# Patient Record
Sex: Male | Born: 1980 | Race: White | Hispanic: No | Marital: Married | Smoking: Former smoker
Health system: Southern US, Community
[De-identification: ages and names within clinical notes are randomized; demographics above are authoritative.]

## PROBLEM LIST (undated history)

## (undated) DIAGNOSIS — K760 Fatty (change of) liver, not elsewhere classified: Secondary | ICD-10-CM

## (undated) DIAGNOSIS — K219 Gastro-esophageal reflux disease without esophagitis: Secondary | ICD-10-CM

## (undated) DIAGNOSIS — I10 Essential (primary) hypertension: Secondary | ICD-10-CM

## (undated) DIAGNOSIS — F419 Anxiety disorder, unspecified: Secondary | ICD-10-CM

## (undated) HISTORY — PX: DRUG INDUCED ENDOSCOPY: SHX6808

## (undated) HISTORY — PX: NO PAST SURGERIES: SHX2092

---

## 2019-07-21 ENCOUNTER — Other Ambulatory Visit: Payer: Self-pay | Admitting: Neurosurgery

## 2019-07-25 NOTE — H&P (Signed)
Patient ID:   [id number] Patient: Nicholas Jarvis  Date of Birth: [birth year] Visit Type: Office Visit   Date: 07/20/2019 01:00 PM Provider: Danae Orleans. Venetia Maxon MD   This 39 year old male presents for back pain.  HISTORY OF PRESENT ILLNESS: 1.  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  Patient recalls no injury, noting symptoms have increased since August.  Initially, patient found sitting to be painful.  After chiropractic treatments, he notes standing is now painful.  Diclofenac 75 mg b.i.d. Offers little relief  8-10 beers per day recently  SI joint injection offered no relief Chiropractic adjustment offered no relief  History:  GERD, Barrett's esophagus, alcoholism, hypertension, lipoma left lower ribcage Surgical history:  None  MRI on canopy   the patient has a large disc herniation at L5-S1 on the right causing right S1 nerve root compression   he currently grades his pain as ranging from 6-9 out of 10 in severity.    He says he is worse with walking or standing.  He has had a surgical opinion and has been recommended surgery but comes for a 2nd opinion today. he describes that he has a poor quality of life because of this problem and he is not able to function.  He is self-employed as a Curator but says he has to sit all the time as he cannot stand and walk without having worsening pain problems.       PAST MEDICAL/SURGICAL HISTORY:   (Detailed)   Disease/disorder Onset Date Management Date Comments Anxiety     Hypertension        PAST MEDICAL HISTORY, SURGICAL HISTORY, FAMILY HISTORY, SOCIAL HISTORY AND REVIEW OF SYSTEMS I have reviewed the patient's past medical, surgical, family and social history as well as the comprehensive review of systems as included on the [facility] history form dated  07/06/2019, which I have signed.  Family History:  (Detailed)   Social History:  (Detailed) Tobacco use reviewed. Preferred language is [place].   Smoking status: Former smoker.  SMOKING STATUS Type Smoking Status Usage Per Day Years Used Total Pack Years  Former smoker         MEDICATIONS: (added, continued or stopped this visit) Started Medication Directions Instruction Stopped  diclofenac sodium 75 mg tablet,delayed release take 1 tablet by oral route 2 times every day    fluoxetine 40 mg capsule take 1 capsule by oral route  every day in the morning    lisinopril 20 mg tablet take 1 tablet by oral route  every day    pantoprazole 40 mg tablet,delayed release take 1 tablet by oral route  every day      ALLERGIES: Ingredient Reaction Medication Name Comment NO KNOWN ALLERGIES    No known allergies. Reviewed, updated.   REVIEW OF SYSTEMS  See scanned patient registration form, dated 07/06/2019, signed and dated on 07/20/2019  Review of Systems Details System Neg/Pos Details Constitutional Negative Chills, Fatigue, Fever, Malaise, Night sweats, Weight gain and Weight loss. ENMT Negative Ear drainage, Hearing loss, Nasal drainage, Otalgia, Sinus pressure and Sore throat. Eyes Negative Eye discharge, Eye pain and Vision changes. Respiratory Negative Chronic cough, Cough, Dyspnea, Known TB exposure and Wheezing. Cardio Negative Chest pain, Claudication, Edema and Irregular heartbeat/palpitations. GI Negative Abdominal pain, Blood in stool, Change in stool pattern, Constipation, Decreased appetite, Diarrhea, Heartburn, Nausea and Vomiting. GU Negative  Dribbling, Dysuria, Erectile dysfunction, Hematuria, Polyuria (Genitourinary), Slow stream, Urinary frequency, Urinary incontinence and Urinary retention. Endocrine Negative Cold intolerance, Heat intolerance, Polydipsia and Polyphagia. Neuro Positive Gait disturbance,  Numbness in extremity. Psych Negative Anxiety, Depression and Insomnia. Integumentary Negative Brittle hair, Brittle nails, Change in shape/size of mole(s), Hair loss, Hirsutism, Hives, Pruritus, Rash and Skin lesion. MS Positive Back pain. Hema/Lymph Negative Easy bleeding, Easy bruising and Lymphadenopathy. Allergic/Immuno Negative Contact allergy, Environmental allergies, Food allergies and Seasonal allergies. Reproductive Negative Penile discharge and Sexual dysfunction.  PHYSICAL EXAM:  Vitals Date Temp F BP Pulse Ht In Wt Lb BMI BSA Pain Score 07/20/2019  155/116 85 72 271.6 36.84  6/10   PHYSICAL EXAM Details General Level of Distress: no acute distress Overall Appearance: obese  Head and Face  Right Left  Fundoscopic Exam:  normal normal    Cardiovascular Cardiac: regular rate and rhythm without murmur  Right Left  Carotid Pulses: normal normal  Respiratory Lungs: clear to auscultation  Neurological Orientation: normal Recent and Remote Memory: normal Attention Span and Concentration:   normal Language: normal Fund of Knowledge: normal  Right Left Sensation: normal normal Upper Extremity Coordination: normal normal  Lower Extremity Coordination: normal normal  Musculoskeletal Gait and Station: normal  Right Left Upper Extremity Muscle Strength: normal normal Lower Extremity Muscle Strength: normal normal Upper Extremity Muscle Tone:  normal normal Lower Extremity Muscle Tone: normal normal   Motor Strength Upper and lower extremity motor strength was tested in the clinically pertinent muscles.     Deep Tendon Reflexes  Right Left Biceps: normal normal Triceps: normal normal Brachioradialis: normal normal Patellar: normal normal Achilles: absent normal  Sensory Sensation was tested at L1 to S1.   Cranial Nerves II. Optic Nerve/Visual Fields: normal III. Oculomotor: normal IV. Trochlear: normal V.  Trigeminal: normal VI. Abducens: normal VII. Facial: normal VIII. Acoustic/Vestibular: normal IX. Glossopharyngeal: normal X. Vagus: normal XI. Spinal Accessory: normal XII. Hypoglossal: normal  Motor and other Tests Lhermittes: negative Rhomberg: negative Pronator drift: absent     Right Left Hoffman's: normal normal Clonus: normal normal Babinski: normal normal SLR: positive at 45 degrees negative Patrick's Corky Sox): negative negative Toe Walk: normal normal Toe Lift: normal normal Heel Walk: normal normal SI Joint: nontender nontender   Additional Findings:   patient is only able to bend to within 2 ft of the floor with his upper extremities outstretched.  He has right sciatic notch discomfort to palpation.  He is able to stand on his heels and toes.    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  PLAN:  I have advised patient to proceed with surgery for this extremely symptomatic disc herniation.  I have also advised him to cut back on his alcohol consumption.  I have advised him to decrease his usage of nonsteroidal anti-inflammatory medications while he consumes so much alcohol.  Orders: Diagnostic Procedures: Assessment Procedure M51.16 Lumbar Spine- AP/Lat Instruction(s)/Education: Assessment Instruction I10 Lifestyle education [phone] Dietary management education, guidance, and counseling  Completed Orders (this encounter) Order Details Reason Side Interpretation Result Initial Treatment Date Region Lumbar Spine- AP/Lat      07/20/2019 All Levels to All Levels Lifestyle education Patient will follow up with Primary Care Physician.       Dietary management education, guidance, and counseling Encouraged patient to eat well balanced diet.        Assessment/Plan  # Detail Type Description  1. Assessment Disc displacement, lumbar (M51.26).  2. Assessment Low back pain, unspecified back pain laterality, with sciatica presence unspecified (M54.5).     3. Assessment Radiculopathy, lumbosacral region (M54.17).     4. 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     5. Assessment Essential (primary) hypertension (I10).     6. Assessment Body mass index (BMI) 36.0-36.9, adult (Z68.36).  Plan Orders Today's instructions / counseling include(s) Dietary management education, guidance, and counseling. Clinical information/comments: Encouraged patient to eat well balanced diet.       Pain Management Plan Pain Scale: 6/10. Method: Numeric Pain Intensity Scale. Location: back. Onset: 07/20/2019. Duration: varies. Quality: discomforting. Pain management follow-up plan of care: Patient will continue medication management..              Provider:  Danae Orleans. Venetia Maxon MD  07/22/2019 07:39 AM    Dictation edited by: Danae Orleans. Venetia Maxon    CC Providers: Marval Regal [facility] [address] [place],  [place]  [address]   Marval Regal  [facility] [address] [place], [place] [address]               Electronically signed by Danae Orleans. Venetia Maxon MD on 07/22/2019 07:39 AM

## 2019-08-01 ENCOUNTER — Encounter (HOSPITAL_COMMUNITY)
Admission: RE | Admit: 2019-08-01 | Discharge: 2019-08-01 | Disposition: A | Discharge status: Home / Self Care | Payer: BC Managed Care – PPO | Source: Ambulatory Visit | Attending: Neurosurgery | Admitting: Neurosurgery

## 2019-08-01 ENCOUNTER — Other Ambulatory Visit: Payer: Self-pay

## 2019-08-01 ENCOUNTER — Other Ambulatory Visit (HOSPITAL_COMMUNITY)
Admission: RE | Admit: 2019-08-01 | Discharge: 2019-08-01 | Disposition: A | Discharge status: Home / Self Care | Payer: BC Managed Care – PPO | Source: Ambulatory Visit | Attending: Neurosurgery | Admitting: Neurosurgery

## 2019-08-01 ENCOUNTER — Encounter (HOSPITAL_COMMUNITY): Payer: Self-pay

## 2019-08-01 DIAGNOSIS — Z01818 Encounter for other preprocedural examination: Secondary | ICD-10-CM | POA: Insufficient documentation

## 2019-08-01 HISTORY — DX: Essential (primary) hypertension: I10

## 2019-08-01 HISTORY — DX: Anxiety disorder, unspecified: F41.9

## 2019-08-01 HISTORY — DX: Gastro-esophageal reflux disease without esophagitis: K21.9

## 2019-08-01 HISTORY — DX: Fatty (change of) liver, not elsewhere classified: K76.0

## 2019-08-01 LAB — CBC
HCT: 48.2 % (ref 39.0–52.0)
Hemoglobin: 15.5 g/dL (ref 13.0–17.0)
MCH: 31.6 pg (ref 26.0–34.0)
MCHC: 32.2 g/dL (ref 30.0–36.0)
MCV: 98.4 fL (ref 80.0–100.0)
Platelets: 240 10*3/uL (ref 150–400)
RBC: 4.9 MIL/uL (ref 4.22–5.81)
RDW: 13.6 % (ref 11.5–15.5)
WBC: 11.4 10*3/uL — ABNORMAL HIGH (ref 4.0–10.5)
nRBC: 0 % (ref 0.0–0.2)

## 2019-08-01 LAB — SARS CORONAVIRUS 2 (TAT 6-24 HRS): SARS Coronavirus 2: NEGATIVE

## 2019-08-01 LAB — COMPREHENSIVE METABOLIC PANEL
ALT: 128 U/L — ABNORMAL HIGH (ref 0–44)
AST: 93 U/L — ABNORMAL HIGH (ref 15–41)
Albumin: 4.7 g/dL (ref 3.5–5.0)
Alkaline Phosphatase: 101 U/L (ref 38–126)
Anion gap: 14 (ref 5–15)
BUN: 10 mg/dL (ref 6–20)
CO2: 24 mmol/L (ref 22–32)
Calcium: 9.4 mg/dL (ref 8.9–10.3)
Chloride: 99 mmol/L (ref 98–111)
Creatinine, Ser: 0.78 mg/dL (ref 0.61–1.24)
GFR calc Af Amer: >60 mL/min (ref >60)
GFR calc non Af Amer: >60 mL/min (ref >60)
Glucose, Bld: 99 mg/dL (ref 70–99)
Potassium: 4 mmol/L (ref 3.5–5.1)
Sodium: 137 mmol/L (ref 135–145)
Total Bilirubin: 0.8 mg/dL (ref 0.3–1.2)
Total Protein: 8.2 g/dL — ABNORMAL HIGH (ref 6.5–8.1)

## 2019-08-01 NOTE — Pre-Procedure Instructions (Signed)
Nicholas Jarvis  08/01/2019    Your procedure is scheduled on Thursday, Aug 04, 2019 at 2:37 PM.   Report to [facility] Entrance "A" Admitting Office at 12:35 PM.   Call this number if you have problems the morning of surgery: [phone]   Questions prior to day of surgery, please call [phone] between 8 & 4 PM.   Remember:  Do not eat or drink after midnight Wednesday, 08/03/19  Take these medicines the morning of surgery with A SIP OF WATER: Pantoprazole (Protonix)  Stop NSAIDS (Diclofenac, Voltaren, Ibuprofen, etc) as of today prior to surgery. Do not use Aspirin containing products, Multivitamins, Herbal medications or Fish oil prior to surgery.    Do not wear jewelry.  Do not wear lotions, powders, cologne or deodorant.  Men may shave face and neck.  Do not bring valuables to the hospital.  [facility] is not responsible for any belongings or valuables.  Contacts, dentures or bridgework may not be worn into surgery.  Leave your suitcase in the car.  After surgery it may be brought to your room.  For patients admitted to the hospital, discharge time will be determined by your treatment team.  Patients discharged the day of surgery will not be allowed to drive home.   [place] - Preparing for Surgery  Before surgery, you can play an important role.  Because skin is not sterile, your skin needs to be as free of germs as possible.  You can reduce the number of germs on you skin by washing with CHG (chlorahexidine gluconate) soap before surgery.  CHG is an antiseptic cleaner which kills germs and bonds with the skin to continue killing germs even after washing.  Oral Hygiene is also important in reducing the risk of infection.  Remember to brush your teeth with your regular toothpaste the morning of surgery.  Please DO NOT use if you have an allergy to CHG or antibacterial soaps.  If your skin becomes reddened/irritated stop using the CHG and inform your nurse  when you arrive at Short Stay.  Do not shave (including legs and underarms) for at least 48 hours prior to the first CHG shower.  You may shave your face.  Please follow these instructions carefully:   1.  Shower with CHG Soap the night before surgery and the morning of Surgery.  2.  If you choose to wash your hair, wash your hair first as usual with your normal shampoo.  3.  After you shampoo, rinse your hair and body thoroughly to remove the shampoo. 4.  Use CHG as you would any other liquid soap.  You can apply chg directly to the skin and wash gently with a      scrungie or washcloth.           5.  Apply the CHG Soap to your body ONLY FROM THE NECK DOWN.   Do not use on open wounds or open sores. Avoid contact with your eyes, ears, mouth and genitals (private parts).  Wash genitals (private parts) with your normal soap - do this prior to using CHG soap.  6.  Wash thoroughly, paying special attention to the area where your surgery will be performed.  7.  Thoroughly rinse your body with warm water from the neck down.  8.  DO NOT shower/wash with your normal soap after using and rinsing off the CHG Soap.  9.  Pat yourself dry with a clean towel.  10.  Wear clean pajamas.            11.  Place clean sheets on your bed the night of your first shower and do not sleep with pets.  Day of Surgery  Shower as above.  Do not apply any lotions/deodorants the morning of surgery.   Please wear clean clothes to the hospital. Remember to brush your teeth with toothpaste.  Please read over the fact sheets that you were given.

## 2019-08-01 NOTE — Progress Notes (Signed)
PCP - Dr. Marval Regal [facility]) Cardiologist - denies  EKG - today   Sleep Study - +stopbang assessment tool sent to PCP  COVID TEST- scheduled for today   Anesthesia review: Yes, BP elevated on arrival and on discharge. Pt states his BP at home was around 140/90 this morning at home. He states it always is high at doctor's appointments.   Patient denies shortness of breath, fever, cough and chest pain at PAT appointment   All instructions explained to the patient, with a verbal understanding of the material. Patient agrees to go over the instructions while at home for a better understanding. Patient also instructed to self quarantine after being tested for COVID-19. The opportunity to ask questions was provided.

## 2019-08-01 NOTE — Progress Notes (Signed)
   08/01/19 1319  OBSTRUCTIVE SLEEP APNEA  Have you ever been diagnosed with sleep apnea through a sleep study? No  Do you snore loudly (loud enough to be heard through closed doors)?  1  Do you often feel tired, fatigued, or sleepy during the daytime (such as falling asleep during driving or talking to someone)? 0  Has anyone observed you stop breathing during your sleep? 0  Do you have, or are you being treated for high blood pressure? 1  BMI more than 35 kg/m2? 1  Age > 50 (1-yes) 0  Neck circumference greater than:Male 16 inches or larger, Male 17inches or larger? 1  Male Gender (Yes=1) 1  Obstructive Sleep Apnea Score 5

## 2019-08-02 LAB — SURGICAL PCR SCREEN
MRSA, PCR: NEGATIVE
Staphylococcus aureus: POSITIVE — AB

## 2019-08-02 NOTE — Progress Notes (Signed)
Anesthesia Chart Review:   Case: [id number] Date/Time: 08/04/19 1422   Procedure: Right Lumbar 5 Sacral 1 Laminectomy with Microdiscectomy (Right ) - 3C   Anesthesia type: General   Pre-op diagnosis: Lumbar disc herniation with radiculopathy   Location: [facility] OR ROOM 21 / [facility] OR   Surgeons: Maeola Harman, MD      DISCUSSION:  - Pt is a 39 year old with hx fatty liver, alcoholism. BMI 40.7  - AST 93, ALT 128. These results are consistent with prior LFTs dating back to 2015 (see care everywhere). I reviewed elevated labs with Dr. Bradley Ferris.     VS: BP (!) 156/106   Pulse (!) 114   Temp 36.8 C (Oral)   Resp 20   Ht 5\' 8"  (1.727 m)   Wt 121.4 kg   SpO2 100%   BMI 40.70 kg/m     PROVIDERS: - PCP is , MD (notes in care everywhere)    LABS: - AST 93, ALT 128. These results are consistent with prior LFTs dating back to 2015 (see care everywhere)   (all labs ordered are listed, but only abnormal results are displayed)  Labs Reviewed  SURGICAL PCR SCREEN - Abnormal; Notable for the following components:      Result Value   Staphylococcus aureus POSITIVE (*)    All other components within normal limits  COMPREHENSIVE METABOLIC PANEL - Abnormal; Notable for the following components:   Total Protein 8.2 (*)    AST 93 (*)    ALT 128 (*)    All other components within normal limits  CBC - Abnormal; Notable for the following components:   WBC 11.4 (*)    All other components within normal limits     IMAGES:  Abdominal 2016 06/30/11 (care everywhere): - Hepatomegaly with steatosis. No gallstones. Limited study  otherwise.    EKG 08/01/19: Sinus tachycardia (104 bpm)   CV: N/A   Past Medical History:  Diagnosis Date  . Anxiety   . Fatty liver   . GERD (gastroesophageal reflux disease)   . Hypertension     Past Surgical History:  Procedure Laterality Date  . DRUG INDUCED ENDOSCOPY    . NO PAST SURGERIES      MEDICATIONS: . diclofenac (VOLTAREN) 75 MG EC  tablet  . FLUoxetine (PROZAC) 40 MG capsule  . lisinopril (ZESTRIL) 20 MG tablet  . pantoprazole (PROTONIX) 40 MG tablet   No current facility-administered medications for this encounter.   If no changes, I anticipate pt can proceed with surgery as scheduled.   08/03/19, FNP-BC [facility] Short Stay Surgical Center/Anesthesiology Phone: [phone] 08/02/2019 1:18 PM

## 2019-08-04 ENCOUNTER — Encounter (HOSPITAL_COMMUNITY)
Admission: RE | Disposition: A | Discharge status: Home / Self Care | Payer: Self-pay | Source: Ambulatory Visit | Attending: Neurosurgery

## 2019-08-04 ENCOUNTER — Ambulatory Visit (HOSPITAL_COMMUNITY): Payer: BC Managed Care – PPO

## 2019-08-04 ENCOUNTER — Ambulatory Visit (HOSPITAL_COMMUNITY): Payer: BC Managed Care – PPO | Admitting: Anesthesiology

## 2019-08-04 ENCOUNTER — Encounter (HOSPITAL_COMMUNITY): Payer: Self-pay | Admitting: Neurosurgery

## 2019-08-04 ENCOUNTER — Ambulatory Visit (HOSPITAL_COMMUNITY): Payer: BC Managed Care – PPO | Admitting: Emergency Medicine

## 2019-08-04 ENCOUNTER — Observation Stay (HOSPITAL_COMMUNITY)
Admission: RE | Admit: 2019-08-04 | Discharge: 2019-08-05 | Disposition: A | Discharge status: Home / Self Care | Payer: BC Managed Care – PPO | Source: Ambulatory Visit | Attending: Neurosurgery | Admitting: Neurosurgery

## 2019-08-04 ENCOUNTER — Other Ambulatory Visit: Payer: Self-pay

## 2019-08-04 DIAGNOSIS — Z87891 Personal history of nicotine dependence: Secondary | ICD-10-CM | POA: Insufficient documentation

## 2019-08-04 DIAGNOSIS — I10 Essential (primary) hypertension: Secondary | ICD-10-CM | POA: Diagnosis not present

## 2019-08-04 DIAGNOSIS — M5126 Other intervertebral disc displacement, lumbar region: Secondary | ICD-10-CM | POA: Diagnosis present

## 2019-08-04 DIAGNOSIS — F1020 Alcohol dependence, uncomplicated: Secondary | ICD-10-CM | POA: Diagnosis not present

## 2019-08-04 DIAGNOSIS — K2270 Barrett's esophagus without dysplasia: Secondary | ICD-10-CM | POA: Diagnosis not present

## 2019-08-04 DIAGNOSIS — K219 Gastro-esophageal reflux disease without esophagitis: Secondary | ICD-10-CM | POA: Insufficient documentation

## 2019-08-04 DIAGNOSIS — F419 Anxiety disorder, unspecified: Secondary | ICD-10-CM | POA: Insufficient documentation

## 2019-08-04 DIAGNOSIS — M4727 Other spondylosis with radiculopathy, lumbosacral region: Secondary | ICD-10-CM | POA: Diagnosis not present

## 2019-08-04 DIAGNOSIS — Z79899 Other long term (current) drug therapy: Secondary | ICD-10-CM | POA: Insufficient documentation

## 2019-08-04 DIAGNOSIS — M5117 Intervertebral disc disorders with radiculopathy, lumbosacral region: Secondary | ICD-10-CM | POA: Diagnosis not present

## 2019-08-04 DIAGNOSIS — Z419 Encounter for procedure for purposes other than remedying health state, unspecified: Secondary | ICD-10-CM

## 2019-08-04 HISTORY — PX: LUMBAR LAMINECTOMY/DECOMPRESSION MICRODISCECTOMY: SHX5026

## 2019-08-04 SURGERY — LUMBAR LAMINECTOMY/DECOMPRESSION MICRODISCECTOMY 1 LEVEL
Anesthesia: General | Site: Spine Lumbar | Laterality: Right

## 2019-08-04 MED ORDER — CEFAZOLIN SODIUM-DEXTROSE 2-4 GM/100ML-% IV SOLN
INTRAVENOUS | Status: AC
  Filled 2019-08-04: qty 100

## 2019-08-04 MED ORDER — FENTANYL CITRATE (PF) 250 MCG/5ML IJ SOLN
INTRAMUSCULAR | Status: DC | PRN
  Administered 2019-08-04: 100 ug via INTRAVENOUS
  Administered 2019-08-04 (×3): 50 ug via INTRAVENOUS

## 2019-08-04 MED ORDER — MIDAZOLAM HCL 5 MG/5ML IJ SOLN
INTRAMUSCULAR | Status: DC | PRN
  Administered 2019-08-04: 2 mg via INTRAVENOUS

## 2019-08-04 MED ORDER — FLUOXETINE HCL 40 MG PO CAPS: 40.0000 mg | ORAL_CAPSULE | Freq: Every day | ORAL | Status: DC

## 2019-08-04 MED ORDER — THROMBIN 5000 UNITS EX SOLR
CUTANEOUS | Status: AC
  Filled 2019-08-04: qty 5000

## 2019-08-04 MED ORDER — DICLOFENAC SODIUM 75 MG PO TBEC
75.0000 mg | DELAYED_RELEASE_TABLET | Freq: Two times a day (BID) | ORAL | Status: DC
  Administered 2019-08-04: 75 mg via ORAL
  Filled 2019-08-04 (×3): qty 1

## 2019-08-04 MED ORDER — CHLORHEXIDINE GLUCONATE CLOTH 2 % EX PADS: 6.0000 | MEDICATED_PAD | Freq: Once | CUTANEOUS | Status: DC

## 2019-08-04 MED ORDER — PANTOPRAZOLE SODIUM 40 MG IV SOLR: 40.0000 mg | Freq: Every day | INTRAVENOUS | Status: DC

## 2019-08-04 MED ORDER — BUPIVACAINE HCL (PF) 0.5 % IJ SOLN
INTRAMUSCULAR | Status: DC | PRN
  Administered 2019-08-04: 5 mL

## 2019-08-04 MED ORDER — THROMBIN 5000 UNITS EX SOLR: OROMUCOSAL | Status: DC | PRN

## 2019-08-04 MED ORDER — LISINOPRIL 20 MG PO TABS
20.0000 mg | ORAL_TABLET | Freq: Every day | ORAL | Status: DC
  Administered 2019-08-04: 20 mg via ORAL
  Filled 2019-08-04: qty 1

## 2019-08-04 MED ORDER — SODIUM CHLORIDE 0.9% FLUSH
3.0000 mL | Freq: Two times a day (BID) | INTRAVENOUS | Status: DC
  Administered 2019-08-04: 3 mL via INTRAVENOUS

## 2019-08-04 MED ORDER — ESMOLOL HCL 100 MG/10ML IV SOLN
INTRAVENOUS | Status: AC
  Filled 2019-08-04: qty 10

## 2019-08-04 MED ORDER — KCL IN DEXTROSE-NACL 20-5-0.45 MEQ/L-%-% IV SOLN: INTRAVENOUS | Status: DC

## 2019-08-04 MED ORDER — LABETALOL HCL 5 MG/ML IV SOLN
INTRAVENOUS | Status: AC
  Filled 2019-08-04: qty 4

## 2019-08-04 MED ORDER — DOCUSATE SODIUM 100 MG PO CAPS
100.0000 mg | ORAL_CAPSULE | Freq: Two times a day (BID) | ORAL | Status: DC
  Administered 2019-08-04: 100 mg via ORAL
  Filled 2019-08-04: qty 1

## 2019-08-04 MED ORDER — ONDANSETRON HCL 4 MG/2ML IJ SOLN
INTRAMUSCULAR | Status: DC | PRN
  Administered 2019-08-04: 4 mg via INTRAVENOUS

## 2019-08-04 MED ORDER — LACTATED RINGERS IV SOLN: INTRAVENOUS | Status: DC

## 2019-08-04 MED ORDER — METHYLPREDNISOLONE ACETATE 80 MG/ML IJ SUSP
INTRAMUSCULAR | Status: AC
  Filled 2019-08-04: qty 1

## 2019-08-04 MED ORDER — FENTANYL CITRATE (PF) 100 MCG/2ML IJ SOLN
INTRAMUSCULAR | Status: DC | PRN
  Administered 2019-08-04: 100 ug via INTRAVENOUS

## 2019-08-04 MED ORDER — ONDANSETRON HCL 4 MG/2ML IJ SOLN
INTRAMUSCULAR | Status: AC
  Filled 2019-08-04: qty 2

## 2019-08-04 MED ORDER — HYDROCODONE-ACETAMINOPHEN 5-325 MG PO TABS
2.0000 | ORAL_TABLET | ORAL | Status: DC | PRN
  Administered 2019-08-04 – 2019-08-05 (×2): 2 via ORAL
  Filled 2019-08-04 (×2): qty 2

## 2019-08-04 MED ORDER — FLUOXETINE HCL 20 MG PO CAPS
40.0000 mg | ORAL_CAPSULE | Freq: Every day | ORAL | Status: DC
  Administered 2019-08-04: 40 mg via ORAL
  Filled 2019-08-04: qty 2

## 2019-08-04 MED ORDER — BUPIVACAINE HCL (PF) 0.5 % IJ SOLN
INTRAMUSCULAR | Status: AC
  Filled 2019-08-04: qty 30

## 2019-08-04 MED ORDER — SUCCINYLCHOLINE CHLORIDE 200 MG/10ML IV SOSY
PREFILLED_SYRINGE | INTRAVENOUS | Status: DC | PRN
  Administered 2019-08-04: 200 mg via INTRAVENOUS

## 2019-08-04 MED ORDER — ORAL CARE MOUTH RINSE: 15.0000 mL | Freq: Once | OROMUCOSAL | Status: AC

## 2019-08-04 MED ORDER — PROPOFOL 10 MG/ML IV BOLUS
INTRAVENOUS | Status: AC
  Filled 2019-08-04: qty 20

## 2019-08-04 MED ORDER — PHENOL 1.4 % MT LIQD: 1.0000 | OROMUCOSAL | Status: DC | PRN

## 2019-08-04 MED ORDER — POLYETHYLENE GLYCOL 3350 17 G PO PACK: 17.0000 g | PACK | Freq: Every day | ORAL | Status: DC | PRN

## 2019-08-04 MED ORDER — OXYCODONE HCL 5 MG PO TABS: 5.0000 mg | ORAL_TABLET | ORAL | Status: DC | PRN

## 2019-08-04 MED ORDER — FENTANYL CITRATE (PF) 100 MCG/2ML IJ SOLN
50.0000 ug | INTRAMUSCULAR | Status: AC
  Administered 2019-08-04: 50 ug via INTRAVENOUS

## 2019-08-04 MED ORDER — CHLORHEXIDINE GLUCONATE 0.12 % MT SOLN: 15.0000 mL | Freq: Once | OROMUCOSAL | Status: AC

## 2019-08-04 MED ORDER — LIDOCAINE 2% (20 MG/ML) 5 ML SYRINGE
INTRAMUSCULAR | Status: AC
  Filled 2019-08-04: qty 5

## 2019-08-04 MED ORDER — PROPOFOL 10 MG/ML IV BOLUS
INTRAVENOUS | Status: DC | PRN
  Administered 2019-08-04: 200 mg via INTRAVENOUS

## 2019-08-04 MED ORDER — CEFAZOLIN SODIUM-DEXTROSE 2-4 GM/100ML-% IV SOLN
2.0000 g | Freq: Three times a day (TID) | INTRAVENOUS | Status: DC
  Administered 2019-08-05: 2 g via INTRAVENOUS
  Filled 2019-08-04: qty 100

## 2019-08-04 MED ORDER — SODIUM CHLORIDE 0.9 % IV SOLN: 250.0000 mL | INTRAVENOUS | Status: DC

## 2019-08-04 MED ORDER — METHYLPREDNISOLONE ACETATE 80 MG/ML IJ SUSP
INTRAMUSCULAR | Status: DC | PRN
  Administered 2019-08-04: 80 mg

## 2019-08-04 MED ORDER — FLEET ENEMA 7-19 GM/118ML RE ENEM: 1.0000 | ENEMA | Freq: Once | RECTAL | Status: DC | PRN

## 2019-08-04 MED ORDER — CHLORHEXIDINE GLUCONATE 0.12 % MT SOLN
OROMUCOSAL | Status: AC
  Administered 2019-08-04: 15 mL via OROMUCOSAL
  Filled 2019-08-04: qty 15

## 2019-08-04 MED ORDER — FENTANYL CITRATE (PF) 100 MCG/2ML IJ SOLN
INTRAMUSCULAR | Status: AC
  Filled 2019-08-04: qty 2

## 2019-08-04 MED ORDER — LIDOCAINE 2% (20 MG/ML) 5 ML SYRINGE
INTRAMUSCULAR | Status: DC | PRN
  Administered 2019-08-04: 60 mg via INTRAVENOUS

## 2019-08-04 MED ORDER — ALUM & MAG HYDROXIDE-SIMETH 200-200-20 MG/5ML PO SUSP: 30.0000 mL | Freq: Four times a day (QID) | ORAL | Status: DC | PRN

## 2019-08-04 MED ORDER — SODIUM CHLORIDE 0.9% FLUSH: 3.0000 mL | INTRAVENOUS | Status: DC | PRN

## 2019-08-04 MED ORDER — DEXAMETHASONE SODIUM PHOSPHATE 10 MG/ML IJ SOLN
INTRAMUSCULAR | Status: DC | PRN
  Administered 2019-08-04: 10 mg via INTRAVENOUS

## 2019-08-04 MED ORDER — METHOCARBAMOL 1000 MG/10ML IJ SOLN
500.0000 mg | Freq: Four times a day (QID) | INTRAVENOUS | Status: DC | PRN
  Filled 2019-08-04: qty 5

## 2019-08-04 MED ORDER — ACETAMINOPHEN 325 MG PO TABS: 650.0000 mg | ORAL_TABLET | ORAL | Status: DC | PRN

## 2019-08-04 MED ORDER — METHOCARBAMOL 500 MG PO TABS
500.0000 mg | ORAL_TABLET | Freq: Four times a day (QID) | ORAL | Status: DC | PRN
  Administered 2019-08-04 – 2019-08-05 (×2): 500 mg via ORAL
  Filled 2019-08-04 (×2): qty 1

## 2019-08-04 MED ORDER — FENTANYL CITRATE (PF) 100 MCG/2ML IJ SOLN
INTRAMUSCULAR | Status: AC
  Administered 2019-08-04: 50 ug via INTRAVENOUS
  Filled 2019-08-04: qty 2

## 2019-08-04 MED ORDER — DEXAMETHASONE SODIUM PHOSPHATE 10 MG/ML IJ SOLN
INTRAMUSCULAR | Status: AC
  Filled 2019-08-04: qty 1

## 2019-08-04 MED ORDER — ZOLPIDEM TARTRATE 5 MG PO TABS: 5.0000 mg | ORAL_TABLET | Freq: Every evening | ORAL | Status: DC | PRN

## 2019-08-04 MED ORDER — ADULT MULTIVITAMIN W/MINERALS CH
1.0000 | ORAL_TABLET | Freq: Every day | ORAL | Status: DC
  Filled 2019-08-04: qty 1

## 2019-08-04 MED ORDER — MIDAZOLAM HCL 2 MG/2ML IJ SOLN
INTRAMUSCULAR | Status: AC
  Filled 2019-08-04: qty 2

## 2019-08-04 MED ORDER — ORAL CARE MOUTH RINSE: 15.0000 mL | Freq: Once | OROMUCOSAL | Status: DC

## 2019-08-04 MED ORDER — ONDANSETRON HCL 4 MG PO TABS: 4.0000 mg | ORAL_TABLET | Freq: Four times a day (QID) | ORAL | Status: DC | PRN

## 2019-08-04 MED ORDER — HYDROMORPHONE HCL 1 MG/ML IJ SOLN: 0.2500 mg | INTRAMUSCULAR | Status: DC | PRN

## 2019-08-04 MED ORDER — BISACODYL 10 MG RE SUPP: 10.0000 mg | Freq: Every day | RECTAL | Status: DC | PRN

## 2019-08-04 MED ORDER — LIDOCAINE-EPINEPHRINE 1 %-1:100000 IJ SOLN
INTRAMUSCULAR | Status: DC | PRN
  Administered 2019-08-04: 5 mL

## 2019-08-04 MED ORDER — CHLORHEXIDINE GLUCONATE 0.12 % MT SOLN: 15.0000 mL | Freq: Once | OROMUCOSAL | Status: DC

## 2019-08-04 MED ORDER — FENTANYL CITRATE (PF) 250 MCG/5ML IJ SOLN
INTRAMUSCULAR | Status: AC
  Filled 2019-08-04: qty 5

## 2019-08-04 MED ORDER — CEFAZOLIN SODIUM-DEXTROSE 2-4 GM/100ML-% IV SOLN: 2.0000 g | INTRAVENOUS | Status: DC

## 2019-08-04 MED ORDER — MENTHOL 3 MG MT LOZG: 1.0000 | LOZENGE | OROMUCOSAL | Status: DC | PRN

## 2019-08-04 MED ORDER — PANTOPRAZOLE SODIUM 40 MG PO TBEC
40.0000 mg | DELAYED_RELEASE_TABLET | Freq: Every day | ORAL | Status: DC
  Administered 2019-08-04: 40 mg via ORAL
  Filled 2019-08-04: qty 1

## 2019-08-04 MED ORDER — HYDROMORPHONE HCL 1 MG/ML IJ SOLN: 0.5000 mg | INTRAMUSCULAR | Status: DC | PRN

## 2019-08-04 MED ORDER — SUGAMMADEX SODIUM 200 MG/2ML IV SOLN
INTRAVENOUS | Status: DC | PRN
  Administered 2019-08-04: 50 mg via INTRAVENOUS
  Administered 2019-08-04: 200 mg via INTRAVENOUS

## 2019-08-04 MED ORDER — ONDANSETRON HCL 4 MG/2ML IJ SOLN: 4.0000 mg | Freq: Four times a day (QID) | INTRAMUSCULAR | Status: DC | PRN

## 2019-08-04 MED ORDER — ROCURONIUM BROMIDE 10 MG/ML (PF) SYRINGE
PREFILLED_SYRINGE | INTRAVENOUS | Status: AC
  Filled 2019-08-04: qty 10

## 2019-08-04 MED ORDER — LIDOCAINE-EPINEPHRINE 1 %-1:100000 IJ SOLN
INTRAMUSCULAR | Status: AC
  Filled 2019-08-04: qty 1

## 2019-08-04 MED ORDER — CEFAZOLIN SODIUM-DEXTROSE 2-3 GM-%(50ML) IV SOLR
INTRAVENOUS | Status: DC | PRN
  Administered 2019-08-04: 2 g via INTRAVENOUS

## 2019-08-04 MED ORDER — ROCURONIUM BROMIDE 10 MG/ML (PF) SYRINGE
PREFILLED_SYRINGE | INTRAVENOUS | Status: DC | PRN
  Administered 2019-08-04: 60 mg via INTRAVENOUS
  Administered 2019-08-04: 20 mg via INTRAVENOUS

## 2019-08-04 MED ORDER — 0.9 % SODIUM CHLORIDE (POUR BTL) OPTIME
TOPICAL | Status: DC | PRN
  Administered 2019-08-04: 1000 mL

## 2019-08-04 MED ORDER — ACETAMINOPHEN 650 MG RE SUPP: 650.0000 mg | RECTAL | Status: DC | PRN

## 2019-08-04 SURGICAL SUPPLY — 52 items
BAND RUBBER #18 3X1/16 STRL (MISCELLANEOUS) ×6 IMPLANT
BLADE CLIPPER SURG (BLADE) IMPLANT
BUR MATCHSTICK NEURO 3.0 LAGG (BURR) ×3 IMPLANT
BUR ROUND FLUTED 5 RND (BURR) ×2 IMPLANT
BUR ROUND FLUTED 5MM RND (BURR) ×1
CANISTER SUCT 3000ML PPV (MISCELLANEOUS) ×3 IMPLANT
CARTRIDGE OIL MAESTRO DRILL (MISCELLANEOUS) ×1 IMPLANT
COVER WAND RF STERILE (DRAPES) ×3 IMPLANT
DECANTER SPIKE VIAL GLASS SM (MISCELLANEOUS) ×3 IMPLANT
DERMABOND ADVANCED (GAUZE/BANDAGES/DRESSINGS) ×2
DERMABOND ADVANCED .7 DNX12 (GAUZE/BANDAGES/DRESSINGS) ×1 IMPLANT
DIFFUSER DRILL AIR PNEUMATIC (MISCELLANEOUS) ×3 IMPLANT
DRAPE LAPAROTOMY 100X72X124 (DRAPES) ×3 IMPLANT
DRAPE MICROSCOPE LEICA (MISCELLANEOUS) ×3 IMPLANT
DRAPE SURG 17X23 STRL (DRAPES) ×3 IMPLANT
DRSG OPSITE POSTOP 3X4 (GAUZE/BANDAGES/DRESSINGS) ×3 IMPLANT
DURAPREP 26ML APPLICATOR (WOUND CARE) ×3 IMPLANT
ELECT REM PT RETURN 9FT ADLT (ELECTROSURGICAL) ×3
ELECTRODE REM PT RTRN 9FT ADLT (ELECTROSURGICAL) ×1 IMPLANT
GAUZE 4X4 16PLY RFD (DISPOSABLE) IMPLANT
GAUZE SPONGE 4X4 12PLY STRL (GAUZE/BANDAGES/DRESSINGS) IMPLANT
GLOVE BIO SURGEON STRL SZ8 (GLOVE) ×3 IMPLANT
GLOVE BIOGEL PI IND STRL 8 (GLOVE) ×1 IMPLANT
GLOVE BIOGEL PI IND STRL 8.5 (GLOVE) ×1 IMPLANT
GLOVE BIOGEL PI INDICATOR 8 (GLOVE) ×2
GLOVE BIOGEL PI INDICATOR 8.5 (GLOVE) ×2
GLOVE ECLIPSE 8.0 STRL XLNG CF (GLOVE) ×3 IMPLANT
GLOVE EXAM NITRILE XL STR (GLOVE) IMPLANT
GOWN STRL REUS W/ TWL LRG LVL3 (GOWN DISPOSABLE) IMPLANT
GOWN STRL REUS W/ TWL XL LVL3 (GOWN DISPOSABLE) ×1 IMPLANT
GOWN STRL REUS W/TWL 2XL LVL3 (GOWN DISPOSABLE) ×3 IMPLANT
GOWN STRL REUS W/TWL LRG LVL3 (GOWN DISPOSABLE)
GOWN STRL REUS W/TWL XL LVL3 (GOWN DISPOSABLE) ×2
HEMOSTAT POWDER KIT SURGIFOAM (HEMOSTASIS) ×3 IMPLANT
KIT BASIN OR (CUSTOM PROCEDURE TRAY) ×3 IMPLANT
KIT TURNOVER KIT B (KITS) ×3 IMPLANT
NEEDLE HYPO 18GX1.5 BLUNT FILL (NEEDLE) IMPLANT
NEEDLE HYPO 25X1 1.5 SAFETY (NEEDLE) ×3 IMPLANT
NEEDLE SPNL 18GX3.5 QUINCKE PK (NEEDLE) ×3 IMPLANT
NS IRRIG 1000ML POUR BTL (IV SOLUTION) ×3 IMPLANT
OIL CARTRIDGE MAESTRO DRILL (MISCELLANEOUS) ×3
PACK LAMINECTOMY NEURO (CUSTOM PROCEDURE TRAY) ×3 IMPLANT
PAD ARMBOARD 7.5X6 YLW CONV (MISCELLANEOUS) ×9 IMPLANT
SPONGE SURGIFOAM ABS GEL SZ50 (HEMOSTASIS) IMPLANT
SUT VIC AB 0 CT1 18XCR BRD8 (SUTURE) ×1 IMPLANT
SUT VIC AB 0 CT1 8-18 (SUTURE) ×2
SUT VIC AB 2-0 CT1 18 (SUTURE) ×3 IMPLANT
SUT VIC AB 3-0 SH 8-18 (SUTURE) ×3 IMPLANT
SYR 5ML LL (SYRINGE) IMPLANT
TOWEL GREEN STERILE (TOWEL DISPOSABLE) ×3 IMPLANT
TOWEL GREEN STERILE FF (TOWEL DISPOSABLE) ×3 IMPLANT
WATER STERILE IRR 1000ML POUR (IV SOLUTION) ×3 IMPLANT

## 2019-08-04 NOTE — Transfer of Care (Signed)
Immediate Anesthesia Transfer of Care Note  Patient: Nicholas Jarvis  Procedure(s) Performed: Right Lumbar Five Sacral One Laminectomy with Microdiscectomy (Right Spine Lumbar)  Patient Location: PACU  Anesthesia Type:General  Level of Consciousness: awake, alert  and oriented  Airway & Oxygen Therapy: Patient Spontanous Breathing and Patient connected to nasal cannula oxygen  Post-op Assessment: Report given to RN and Post -op Vital signs reviewed and stable  Post vital signs: Reviewed and stable  Last Vitals:  Vitals Value Taken Time  BP 147/91 08/04/19 2008  Temp    Pulse 108 08/04/19 2010  Resp 16 08/04/19 2010  SpO2 98 % 08/04/19 2010  Vitals shown include unvalidated device data.  Last Pain:  Vitals:   08/04/19 1512  TempSrc:   PainSc: 2       Patients Stated Pain Goal: 3 (08/04/19 1512)  Complications: No apparent anesthesia complications

## 2019-08-04 NOTE — Brief Op Note (Signed)
08/04/2019  8:03 PM  PATIENT:  Nicholas Jarvis  39 y.o. 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, spondylosis, degenerative disc disease, lumbago   PROCEDURE:  Procedure(s): Right Lumbar Five Sacral One Laminectomy with Microdiscectomy (Right)  SURGEON:  Surgeon(s) and Role:    * Stern, Joseph, MD - Primary  PHYSICIAN ASSISTANT:   ASSISTANTS: Poteat, RN   ANESTHESIA:   general  EBL:  50 mL   BLOOD ADMINISTERED:none  DRAINS: none   LOCAL MEDICATIONS USED:  MARCAINE    and LIDOCAINE   SPECIMEN:  No Specimen  DISPOSITION OF SPECIMEN:  N/A  COUNTS:  YES  TOURNIQUET:  * No tourniquets in log *  DICTATION: Patient has a large L 5 S 1 disc rupture on the right with significant right leg pain and weakness. It was elected to take him to surgery for right L 5 S 1 microdiscectomy.  Procedure: Patient was brought to the operating room and following the smooth and uncomplicated induction of general endotracheal anesthesia he was placed in a prone position on the Wilson frame. Low back was prepped and draped in the usual sterile fashion with betadine scrub and DuraPrep. Preoperative localizing X ray was obtained with a spinal needle.  Area of planned incision was infiltrated with local lidocaine. Incision was made in the midline and carried to the lumbodorsal fascia which was incised on the right side of midline. Subperiosteal dissection was performed exposing what was felt to be L 5 S 1 level. Intraoperative x-ray demonstrated marker probe at L 5 S 1.  A hemi-semi-laminectomy of L 5 was performed a high-speed drill and completed with Kerrison rongeurs and a generous foraminotomy was performed overlying the superior aspect of the S 1 lamina. Ligamentum flavum was detached and removed in a piecemeal fashion and  the S 1 nerve root was decompressed laterally with removal of the superior aspect of the facet and ligamentum causing nerve root compression. The microscope was brought into the field and the S 1 nerve root was mobilized medially. This exposed a large amount of soft disc material and a superiorly migrated free fragment of herniated disc material. Multiple fragments were removed and these extended into the interspace which appeared to be quite soft with a disrupted annulus overlying the interspace. As a result it was elected to further decompress the interspace and remove loose disc material and this was done with a variety of pituitary rongeurs. The redundant annulus was also removed with 2 mm Kerrison rongeur.  At this point it was felt that all neural elements were well decompressed and there was no evidence of residual loose disc material within the interspace. The interspace was then irrigated with saline and no additional disc material was mobilized. Hemostasis was assured with bipolar electrocautery and the interspace was irrigated with Depo-Medrol and fentanyl. The lumbodorsal fascia was closed with 0 Vicryl sutures the subcutaneous tissues reapproximated 2-0 Vicryl inverted sutures and the skin edges were reapproximated with 3-0 Vicryl subcuticular stitch. The wound is dressed with Dermabond and an occlusive dressing. Patient was extubated in the operating room and taken to recovery in stable and satisfactory condition having tolerated his operation well counts were correct at the end of the case.   PLAN OF CARE: Admit for overnight observation  PATIENT DISPOSITION:  PACU - hemodynamically stable.   Delay start   of Pharmacological VTE agent (>24hrs) due to surgical blood loss or risk of bleeding: yes

## 2019-08-04 NOTE — Anesthesia Postprocedure Evaluation (Signed)
Anesthesia Post Note  Patient: Nicholas Jarvis  Procedure(s) Performed: Right Lumbar Five Sacral One Laminectomy with Microdiscectomy (Right Spine Lumbar)     Patient location during evaluation: PACU Anesthesia Type: General Level of consciousness: awake Pain management: pain level controlled Vital Signs Assessment: post-procedure vital signs reviewed and stable Respiratory status: spontaneous breathing Cardiovascular status: stable Postop Assessment: no apparent nausea or vomiting Anesthetic complications: no Comments: Case cancelled by surgeon in OR on Lovenox    Last Vitals:  Vitals:   08/04/19 2052 08/04/19 2111  BP: (!) 141/91 126/82  Pulse: 91 91  Resp: 13 18  Temp: 36.8 C 37.2 C  SpO2: 96% 98%    Last Pain:  Vitals:   08/04/19 2130  TempSrc:   PainSc: 6                  [name]

## 2019-08-04 NOTE — Op Note (Signed)
08/04/2019  8:03 PM  PATIENT:  Nicholas Jarvis  39 y.o. 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, spondylosis, degenerative disc disease, lumbago   PROCEDURE:  Procedure(s): Right Lumbar Five Sacral One Laminectomy with Microdiscectomy (Right)  SURGEON:  Surgeon(s) and Role:    Maeola Harman, MD - Primary  PHYSICIAN ASSISTANT:   ASSISTANTS: Poteat, RN   ANESTHESIA:   general  EBL:  50 mL   BLOOD ADMINISTERED:none  DRAINS: none   LOCAL MEDICATIONS USED:  MARCAINE    and LIDOCAINE   SPECIMEN:  No Specimen  DISPOSITION OF SPECIMEN:  N/A  COUNTS:  YES  TOURNIQUET:  * No tourniquets in log *  DICTATION: Patient has a large L 5 S 1 disc rupture on the right with significant right leg pain and weakness. It was elected to take him to surgery for right L 5 S 1 microdiscectomy.  Procedure: Patient was brought to the operating room and following the smooth and uncomplicated induction of general endotracheal anesthesia he was placed in a prone position on the Wilson frame. Low back was prepped and draped in the usual sterile fashion with betadine scrub and DuraPrep. Preoperative localizing X ray was obtained with a spinal needle.  Area of planned incision was infiltrated with local lidocaine. Incision was made in the midline and carried to the lumbodorsal fascia which was incised on the right side of midline. Subperiosteal dissection was performed exposing what was felt to be L 5 S 1 level. Intraoperative x-ray demonstrated marker probe at L 5 S 1.  A hemi-semi-laminectomy of L 5 was performed a high-speed drill and completed with Kerrison rongeurs and a generous foraminotomy was performed overlying the superior aspect of the S 1 lamina. Ligamentum flavum was detached and removed in a piecemeal fashion and  the S 1 nerve root was decompressed laterally with removal of the superior aspect of the facet and ligamentum causing nerve root compression. The microscope was brought into the field and the S 1 nerve root was mobilized medially. This exposed a large amount of soft disc material and a superiorly migrated free fragment of herniated disc material. Multiple fragments were removed and these extended into the interspace which appeared to be quite soft with a disrupted annulus overlying the interspace. As a result it was elected to further decompress the interspace and remove loose disc material and this was done with a variety of pituitary rongeurs. The redundant annulus was also removed with 2 mm Kerrison rongeur.  At this point it was felt that all neural elements were well decompressed and there was no evidence of residual loose disc material within the interspace. The interspace was then irrigated with saline and no additional disc material was mobilized. Hemostasis was assured with bipolar electrocautery and the interspace was irrigated with Depo-Medrol and fentanyl. The lumbodorsal fascia was closed with 0 Vicryl sutures the subcutaneous tissues reapproximated 2-0 Vicryl inverted sutures and the skin edges were reapproximated with 3-0 Vicryl subcuticular stitch. The wound is dressed with Dermabond and an occlusive dressing. Patient was extubated in the operating room and taken to recovery in stable and satisfactory condition having tolerated his operation well counts were correct at the end of the case.   PLAN OF CARE: Admit for overnight observation  PATIENT DISPOSITION:  PACU - hemodynamically stable.   Delay start  of Pharmacological VTE agent (>24hrs) due to surgical blood loss or risk of bleeding: yes

## 2019-08-04 NOTE — Interval H&P Note (Signed)
History and Physical Interval Note:  08/04/2019 5:35 PM  Nicholas Jarvis  has presented today for surgery, with the diagnosis of Lumbar disc herniation with radiculopathy.  The various methods of treatment have been discussed with the patient and family. After consideration of risks, benefits and other options for treatment, the patient has consented to  Procedure(s) with comments: Right Lumbar 5 Sacral 1 Laminectomy with Microdiscectomy (Right) - 3C as a surgical intervention.  The patient's history has been reviewed, patient examined, no change in status, stable for surgery.  I have reviewed the patient's chart and labs.  Questions were answered to the patient's satisfaction.     Dorian Heckle

## 2019-08-04 NOTE — Anesthesia Preprocedure Evaluation (Signed)
Anesthesia Evaluation  Patient identified by MRN, date of birth, ID band Patient awake    Reviewed: Allergy & Precautions, NPO status , Patient's Chart, lab work & pertinent test results  Airway Mallampati: II  TM Distance: >3 FB     Dental   Pulmonary former smoker,    breath sounds clear to auscultation       Cardiovascular hypertension,  Rhythm:Regular Rate:Normal     Neuro/Psych    GI/Hepatic Neg liver ROS, GERD  ,  Endo/Other  negative endocrine ROS  Renal/GU negative Renal ROS     Musculoskeletal   Abdominal   Peds  Hematology   Anesthesia Other Findings   Reproductive/Obstetrics                             Anesthesia Physical Anesthesia Plan  ASA: III  Anesthesia Plan: General   Post-op Pain Management:    Induction: Intravenous  PONV Risk Score and Plan: 2 and Ondansetron and Midazolam  Airway Management Planned: Oral ETT  Additional Equipment:   Intra-op Plan:   Post-operative Plan: Possible Post-op intubation/ventilation  Informed Consent:     Dental advisory given  Plan Discussed with:   Anesthesia Plan Comments:         Anesthesia Quick Evaluation

## 2019-08-04 NOTE — Anesthesia Procedure Notes (Signed)
Procedure Name: Intubation Date/Time: 08/04/2019 6:54 PM Performed by: Jearld Pies, CRNA Pre-anesthesia Checklist: Patient identified, Emergency Drugs available, Suction available and Patient being monitored Patient Re-evaluated:Patient Re-evaluated prior to induction Oxygen Delivery Method: Circle System Utilized Preoxygenation: Pre-oxygenation with 100% oxygen Induction Type: IV induction, Rapid sequence and Cricoid Pressure applied Laryngoscope Size: Mac and 3 Grade View: Grade II Tube type: Oral Tube size: 7.5 mm Number of attempts: 1 Airway Equipment and Method: Stylet Placement Confirmation: ETT inserted through vocal cords under direct vision,  positive ETCO2 and breath sounds checked- equal and bilateral Secured at: 23 cm Tube secured with: Tape Dental Injury: Teeth and Oropharynx as per pre-operative assessment

## 2019-08-04 NOTE — Progress Notes (Signed)
Called Dr Michelle Piper, informed him of patient's BP range in SS from 197/133 upon arrival at 1300  - BP 156/96 at 1512.  Current BP at 1535 178/109.  No orders given.  Will continue to monitor patient.  Patient states that he is still comfortable after getting medication for pain.

## 2019-08-05 DIAGNOSIS — M5117 Intervertebral disc disorders with radiculopathy, lumbosacral region: Secondary | ICD-10-CM | POA: Diagnosis not present

## 2019-08-05 MED ORDER — METHOCARBAMOL 500 MG PO TABS: 500.0000 mg | ORAL_TABLET | Freq: Four times a day (QID) | ORAL | 1 refills | Status: AC | PRN

## 2019-08-05 MED ORDER — OXYCODONE HCL 5 MG PO TABS: 5.0000 mg | ORAL_TABLET | ORAL | 0 refills | Status: AC | PRN

## 2019-08-05 NOTE — Discharge Instructions (Signed)

## 2019-08-05 NOTE — Progress Notes (Addendum)
Subjective: Patient reports "I'm doing ok"  Objective: Vital signs in last 24 hours: Temp:  [97.5 F (36.4 C)-98.9 F (37.2 C)] 97.7 F (36.5 C) (05/28 0718) Pulse Rate:  [76-109] 84 (05/28 0718) Resp:  [10-20] 19 (05/28 0718) BP: (126-197)/(79-133) 149/83 (05/28 0718) SpO2:  [94 %-100 %] 96 % (05/28 0718) Weight:  [120.2 kg] 120.2 kg (05/27 1300)  Intake/Output from previous day: 05/27 0701 - 05/28 0700 In: 1325 [I.V.:1275; IV Piggyback:50] Out: 200 [Urine:150; Blood:50] Intake/Output this shift: No intake/output data recorded.  Alert, conversant. Reports no pain at present. Good strength BLE. Incsiions without erythema swelling or drainage beneath honeycomb and Dermabond.   Lab Results: No results for input(s): WBC, HGB, HCT, PLT in the last 72 hours. BMET No results for input(s): NA, K, CL, CO2, GLUCOSE, BUN, CREATININE, CALCIUM in the last 72 hours.  Studies/Results: DG Lumbar Spine 2-3 Views  Result Date: 08/04/2019 CLINICAL DATA:  L5-S1 discectomy EXAM: LUMBAR SPINE - 2-3 VIEW COMPARISON:  07/20/2019 FINDINGS: Three lateral views of the lumbar spine are submitted. Image 1 demonstrates linear localizing instrument overlying posterior soft tissues at approximate S2 level. Subsequent image demonstrates linear localizing instrument overlying the posterior soft tissues at the L5-S1 level. Image 3 demonstrates surgical instruments overlying the spinous process and posterior elements at the L5-S1 level. IMPRESSION: Limited lateral views of the lumbar spine obtained intraoperatively for localization purposes Electronically Signed   By: Jasmine Pang M.D.   On: 08/04/2019 20:20    Assessment/Plan: improved  LOS: 0 days  Ok to d/c to home today per DrStern. Pt verbalizes understanding of d/c instructions and will call to schedule 3-4week f/u appt. He acknowledges need to reduce alcohol intake. Oxycodone 5mg  will be eRx'ed for prn pain control, Robaxin 500mg  for spasm.   Poteat 08/05/2019, 7:19 AM  Leg strength full, pain and numbness resolved.  Patient is doing well.  Discharge home.

## 2019-08-05 NOTE — Discharge Summary (Signed)
Physician Discharge Summary  Patient ID: Nicholas Jarvis MRN: [id number] DOB/AGE: 1980-11-08 39 y.o.  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 Following uncomplicated surgery (above), he recovered nicely and transferred to [facility] for nursing care. He is mobilizing well with good pain relief.  Consults: None  Significant Diagnostic Studies: radiology: X-Ray: intar-op  Treatments: surgery: Right Lumbar Five Sacral One Laminectomy with Microdiscectomy (Right)     Discharge Exam: Blood pressure (!) 149/83, pulse 84, temperature 97.7 F (36.5 C), temperature source Oral, resp. rate 19, height 5\' 8"  (1.727 m), weight 120.2 kg, SpO2 96 %. Alert, conversant. Reports no pain at present. Good strength BLE. Incsiions without erythema swelling or drainage beneath honeycomb and Dermabond.      Disposition: Discharge disposition: 01-Home or Self Care  Pt verbalizes understanding of d/c instructions and will call to schedule 3-4week f/u appt. He acknowledges need to reduce alcohol intake. Oxycodone 5mg  will be eRx'ed for prn pain control, Robaxin 500mg  for spasm.          Discharge Instructions    Diet - low sodium heart healthy   Complete by: As directed    Increase activity slowly   Complete by: As directed      Allergies as of 08/05/2019   No Known Allergies     Medication List    TAKE these medications   diclofenac 75 MG EC tablet Commonly known  as: VOLTAREN Take 75 mg by mouth 2 (two) times daily.   FLUoxetine 40 MG capsule Commonly known as: PROZAC Take 40 mg by mouth at bedtime.   lisinopril 20 MG tablet Commonly known as: ZESTRIL Take 20 mg by mouth daily.   methocarbamol 500 MG tablet Commonly known as: ROBAXIN Take 1 tablet (500 mg total) by mouth every 6 (six) hours as needed for muscle spasms.   multivitamin Tabs tablet Take 1 tablet by mouth daily.   oxyCODONE 5 MG immediate release tablet Commonly known as: Oxy IR/ROXICODONE Take 1 tablet (5 mg total) by mouth every 4 (four) hours as needed for moderate pain ((score 4 to 6)).   pantoprazole 40 MG tablet Commonly known as: PROTONIX Take 40 mg by mouth daily.        Signed: 08/05/2019, 7:23 AM  Discharge home.

## 2019-08-05 NOTE — Evaluation (Signed)
Physical Therapy Evaluation Patient Details Name: Nicholas Jarvis MRN: [id number] DOB: Mar 09, 1981 Today's Date: 08/05/2019   History of Present Illness  Pt is a 39 y.o male s/p R L5-S1 Microdiscectomy. No significant PMH on file.  Clinical Impression  Pt presented supine in bed with HOB elevated, awake and willing to participate in therapy session. Prior to admission, pt reported that he was independent with all functional mobility and ADLs. Pt lives with his wife and two children in a two level home with a level entry. At the time of evaluation, pt moving very well overall without needing any physical assistance. He tolerated hallway ambulation and stair training without difficulties. PT provided pt education re: back precautions with handout provided, car transfers and a generalized walking program for pt to initiate upon d/c home. Pt expressed understanding. No further acute PT needs identified at this time. PT signing off.     Follow Up Recommendations No PT follow up    Equipment Recommendations  None recommended by PT    Recommendations for Other Services       Precautions / Restrictions Precautions Precautions: Back Precaution Booklet Issued: Yes (comment) Precaution Comments: reviewed precautions with pt throughout and provided handout Restrictions Weight Bearing Restrictions: No      Mobility  Bed Mobility Overal bed mobility: Modified Independent             General bed mobility comments: cueing for log roll  Transfers Overall transfer level: Modified independent Equipment used: None                Ambulation/Gait Ambulation/Gait assistance: Supervision Gait Distance (Feet): 500 Feet Assistive device: None Gait Pattern/deviations: WFL(Within Functional Limits) Gait velocity: WFL   General Gait Details: no instability or LOB  Stairs Stairs: Yes Stairs assistance: Supervision Stair Management: One rail Right;One rail Left;Alternating  pattern;Forwards Number of Stairs: 10 General stair comments: pt performing without difficulties  Wheelchair Mobility    Modified Rankin (Stroke Patients Only)       Balance Overall balance assessment: No apparent balance deficits (not formally assessed)                                           Pertinent Vitals/Pain Pain Assessment: 0-10 Pain Score: 2  Faces Pain Scale: Hurts a little bit Pain Location: lumbar incision Pain Descriptors / Indicators: Aching Pain Intervention(s): Monitored during session;Repositioned    Home Living Family/patient expects to be discharged to:: Private residence Living Arrangements: Spouse/significant other;Children Available Help at Discharge: Family;Available PRN/intermittently Type of Home: House Home Access: Stairs to enter   [employer] of Steps: threshold Home Layout: Two level;Bed/bath upstairs Home Equipment: None      Prior Function Level of Independence: Independent         Comments: working as Curator, 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     Hand Dominance        Extremity/Trunk Assessment   Upper Extremity Assessment Upper Extremity Assessment: Defer to OT evaluation;Overall WFL for tasks assessed    Lower Extremity Assessment Lower Extremity Assessment: Overall WFL for tasks assessed    Cervical / Trunk Assessment Cervical / Trunk Assessment: Other exceptions Cervical / Trunk Exceptions: s/p lumbar sx  Communication   Communication: No difficulties  Cognition Arousal/Alertness: Awake/alert Behavior During Therapy: WFL for tasks assessed/performed Overall Cognitive Status: Within Functional Limits for  tasks assessed                                        General Comments      Exercises     Assessment/Plan    PT Assessment Patent does not need any further PT services  PT Problem List         PT Treatment  Interventions      PT Goals (Current goals can be found in the Care Plan section)  Acute Rehab PT Goals Patient Stated Goal: return to work PT Goal Formulation: All assessment and education complete, DC therapy    Frequency     Barriers to discharge        Co-evaluation               AM-PAC PT "6 Clicks" Mobility  Outcome Measure Help needed turning from your back to your side while in a flat bed without using bedrails?: None Help needed moving from lying on your back to sitting on the side of a flat bed without using bedrails?: None Help needed moving to and from a bed to a chair (including a wheelchair)?: None Help needed standing up from a chair using your arms (e.g., wheelchair or bedside chair)?: None Help needed to walk in hospital room?: None Help needed climbing 3-5 steps with a railing? : None 6 Click Score: 24    End of Session   Activity Tolerance: Patient tolerated treatment well Patient left: in chair;with call bell/phone within reach;with family/visitor present Nurse Communication: Mobility status PT Visit Diagnosis: Other abnormalities of gait and mobility (R26.89)    Time: 8242-3536 PT Time Calculation (min) (ACUTE ONLY): 16 min   Charges:   PT Evaluation $PT Eval Low Complexity: 1 Low          Eduard Clos, PT, DPT  Acute Rehabilitation Services Pager [phone] Office [place] 08/05/2019, 10:52 AM

## 2019-08-05 NOTE — Evaluation (Signed)
Occupational Therapy Evaluation Patient Details Name: Nicholas Jarvis MRN: [id number] DOB: November 26, 1980 Today's Date: 08/05/2019    History of Present Illness 39 y.o male s/p R L5-S1 Microdiscectomy. No significant PMH on file.   Clinical Impression   PTA pt living independently with family- still works as Curator. At time of eval, pt presents with ability to complete BADL, functional transfers, and mobility at mod I level. Back handout provided and reviewed adls in detail. Pt educated on: set an alarm at night for medication, avoid sitting for long periods of time, correct bed positioning for sleeping, correct sequence for bed mobility, avoiding lifting more than 5 pounds and never wash directly over incision. All education is complete and patient indicates understanding. No DME needed at home for this time. Pt does have adjustable bed. No further OT needs identified, OT will sign off. Thank you for this consult.    Follow Up Recommendations  No OT follow up    Equipment Recommendations  None recommended by OT    Recommendations for Other Services       Precautions / Restrictions Precautions Precautions: Back Precaution Booklet Issued: Yes (comment) Precaution Comments: reviewed precautions in BADL context; no brace required Restrictions Weight Bearing Restrictions: No      Mobility Bed Mobility Overal bed mobility: Modified Independent                Transfers Overall transfer level: Modified independent                    Balance Overall balance assessment: No apparent balance deficits (not formally assessed)                                         ADL either performed or assessed with clinical judgement   ADL Overall ADL's : Modified independent                                       General ADL Comments: Pt demonstrates ability to complete BADL at mod I level. Reviewed precautions with common BADL/IADL routines.  Pt able to demonstrate necessary BADL without external assist. Pt is completing functional mobility without DME or assist     Vision Patient Visual Report: No change from baseline       Perception     Praxis      Pertinent Vitals/Pain Pain Assessment: Faces Faces Pain Scale: Hurts a little bit Pain Location: lumbar incision Pain Descriptors / Indicators: Aching Pain Intervention(s): Monitored during session     Hand Dominance     Extremity/Trunk Assessment Upper Extremity Assessment Upper Extremity Assessment: Overall WFL for tasks assessed   Lower Extremity Assessment Lower Extremity Assessment: Overall WFL for tasks assessed       Communication Communication Communication: No difficulties   Cognition Arousal/Alertness: Awake/alert Behavior During Therapy: WFL for tasks assessed/performed Overall Cognitive Status: Within Functional Limits for tasks assessed                                     General Comments       Exercises     Shoulder Instructions      Home Living Family/patient expects to be discharged to:: Private residence Living Arrangements: Spouse/significant  other;Children Available Help at Discharge: Family;Available PRN/intermittently Type of Home: House Home Access: Stairs to enter [employer] of Steps: threshold   Home Layout: Two level;Bed/bath upstairs Alternate Level Stairs-Number of Steps: 6, landing, 6   Bathroom Shower/Tub: Teacher, early years/pre: Standard     Home Equipment: None          Prior Functioning/Environment Level of Independence: Independent        Comments: working as Dealer, 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        OT Problem List: Decreased knowledge of use of DME or AE;Decreased knowledge of precautions;Pain      OT Treatment/Interventions:      OT Goals(Current goals can be found in the care plan section) Acute Rehab OT  Goals Patient Stated Goal: return to work OT Goal Formulation: All assessment and education complete, DC therapy  OT Frequency:     Barriers to D/C:            Co-evaluation              AM-PAC OT "6 Clicks" Daily Activity     Outcome Measure Help from another person eating meals?: None Help from another person taking care of personal grooming?: None Help from another person toileting, which includes using toliet, bedpan, or urinal?: None Help from another person bathing (including washing, rinsing, drying)?: None Help from another person to put on and taking off regular upper body clothing?: None Help from another person to put on and taking off regular lower body clothing?: None 6 Click Score: 24   End of Session Nurse Communication: Mobility status  Activity Tolerance: Patient tolerated treatment well Patient left: in bed;with call bell/phone within reach  OT Visit Diagnosis: Other abnormalities of gait and mobility (R26.89);Pain Pain - part of body: (back)                Time: 3428-7681 OT Time Calculation (min): 11 min Charges:  OT General Charges $OT Visit: 1 Visit OT Evaluation $OT Eval Low Complexity: 1 Low  Zenovia Jarred, MSOT, OTR/L Acute Rehabilitation Services [facility] Office Number: [phone] Pager: [phone]  Zenovia Jarred 08/05/2019, 9:29 AM

## 2019-08-05 NOTE — Progress Notes (Signed)
Patient is discharged from room 3C03 at this time. Alert and in stable condition. IV site d/c'[d and instructions read to patient and spouse with understanding verbalized. Left unit via wheelchair with all belongings at side. 

## 2021-11-02 IMAGING — CR DG LUMBAR SPINE 2-3V
3 series · 3 of 3 positions shown · non-contrast
Comparison: 07/20/2019

CLINICAL DATA: L5-S1 discectomy

EXAM:
LUMBAR SPINE - 2-3 VIEW

[xtable lateral (1 of 3)]
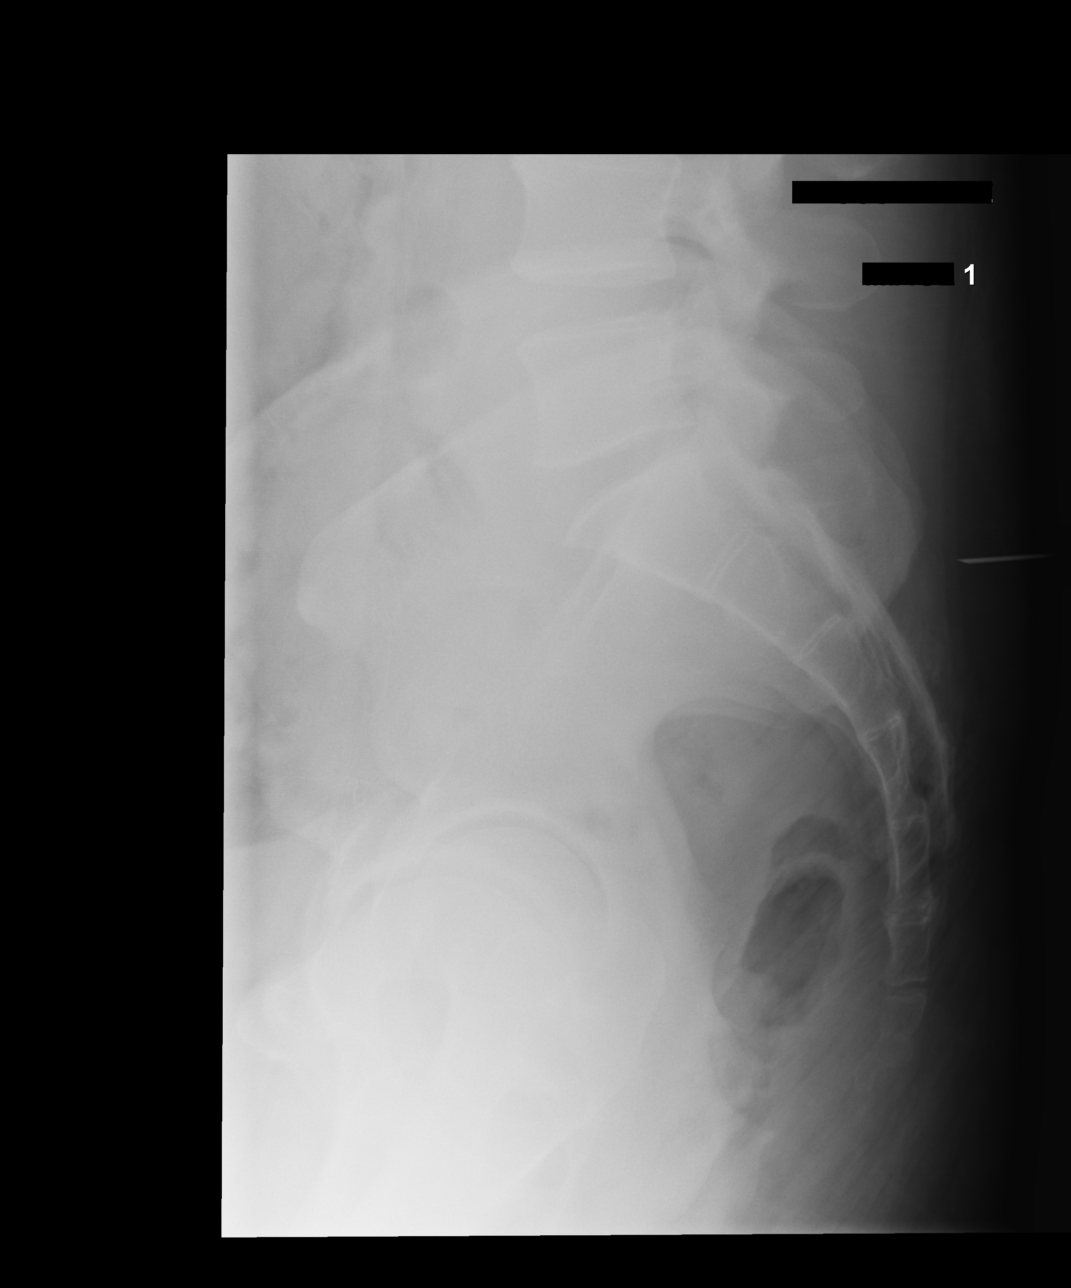

[xtable lateral (2 of 3)]
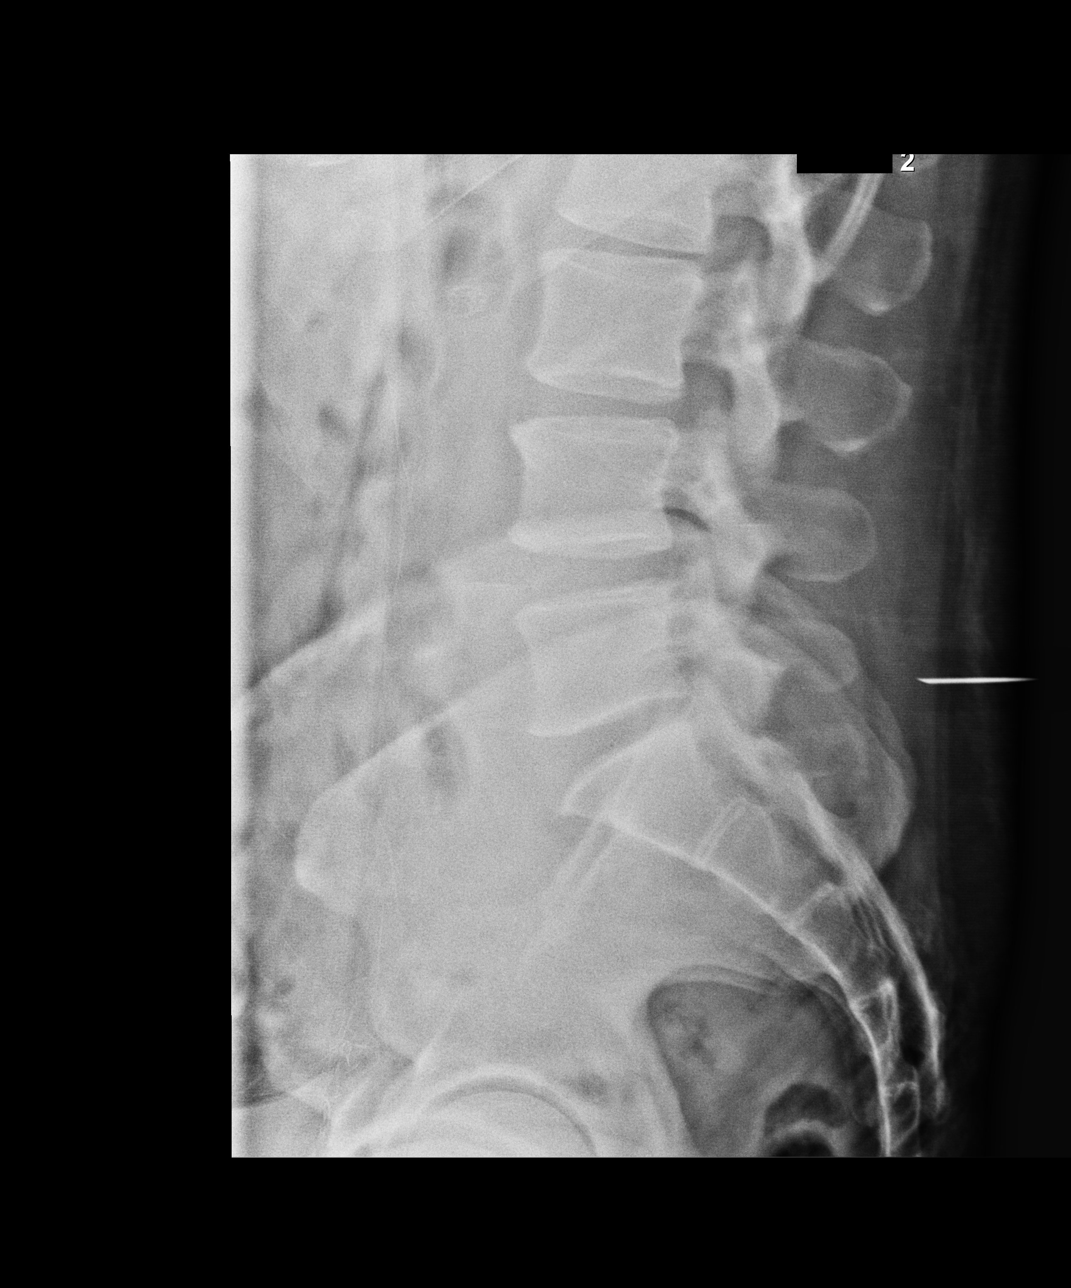

[xtable lateral (3 of 3)]
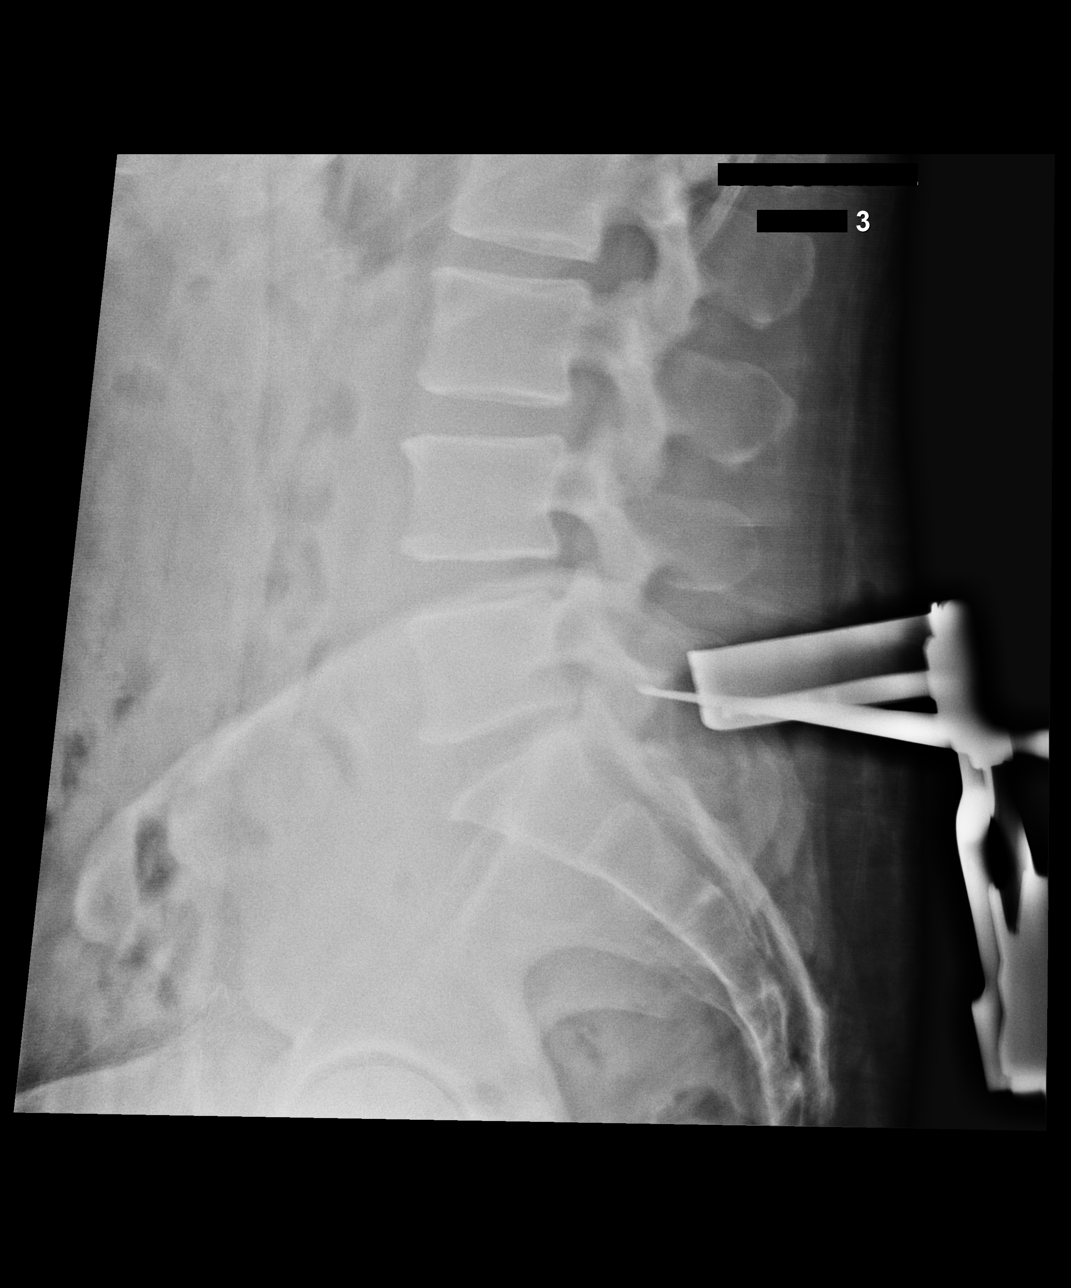

[3 of 3 positions shown; findings below may reference images not displayed]

FINDINGS: Three lateral views of the lumbar spine are submitted. Image 1
demonstrates linear localizing instrument overlying posterior soft
tissues at approximate S2 level. Subsequent image demonstrates
linear localizing instrument overlying the posterior soft tissues at
the L5-S1 level. Image 3 demonstrates surgical instruments overlying
the spinous process and posterior elements at the L5-S1 level.
IMPRESSION: Limited lateral views of the lumbar spine obtained intraoperatively
for localization purposes
